# Patient Record
Sex: Male | Born: 2000 | Race: White | Hispanic: No | Marital: Single | Smoking: Never smoker
Health system: Southern US, Community
[De-identification: ages and names within clinical notes are randomized; demographics above are authoritative.]

---
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
## 2021-05-20 IMAGING — CR DG CHEST 2V
1 series · 2 of 2 positions shown · non-contrast
Comparison: None.

CLINICAL DATA: Shortness of breath, [id number] positive, febrile

EXAM:
CHEST - 2 VIEW

[Series 1: dg chest 2 view · 0.14mm/px · 2 of 2 slices shown]
[im 1/2]
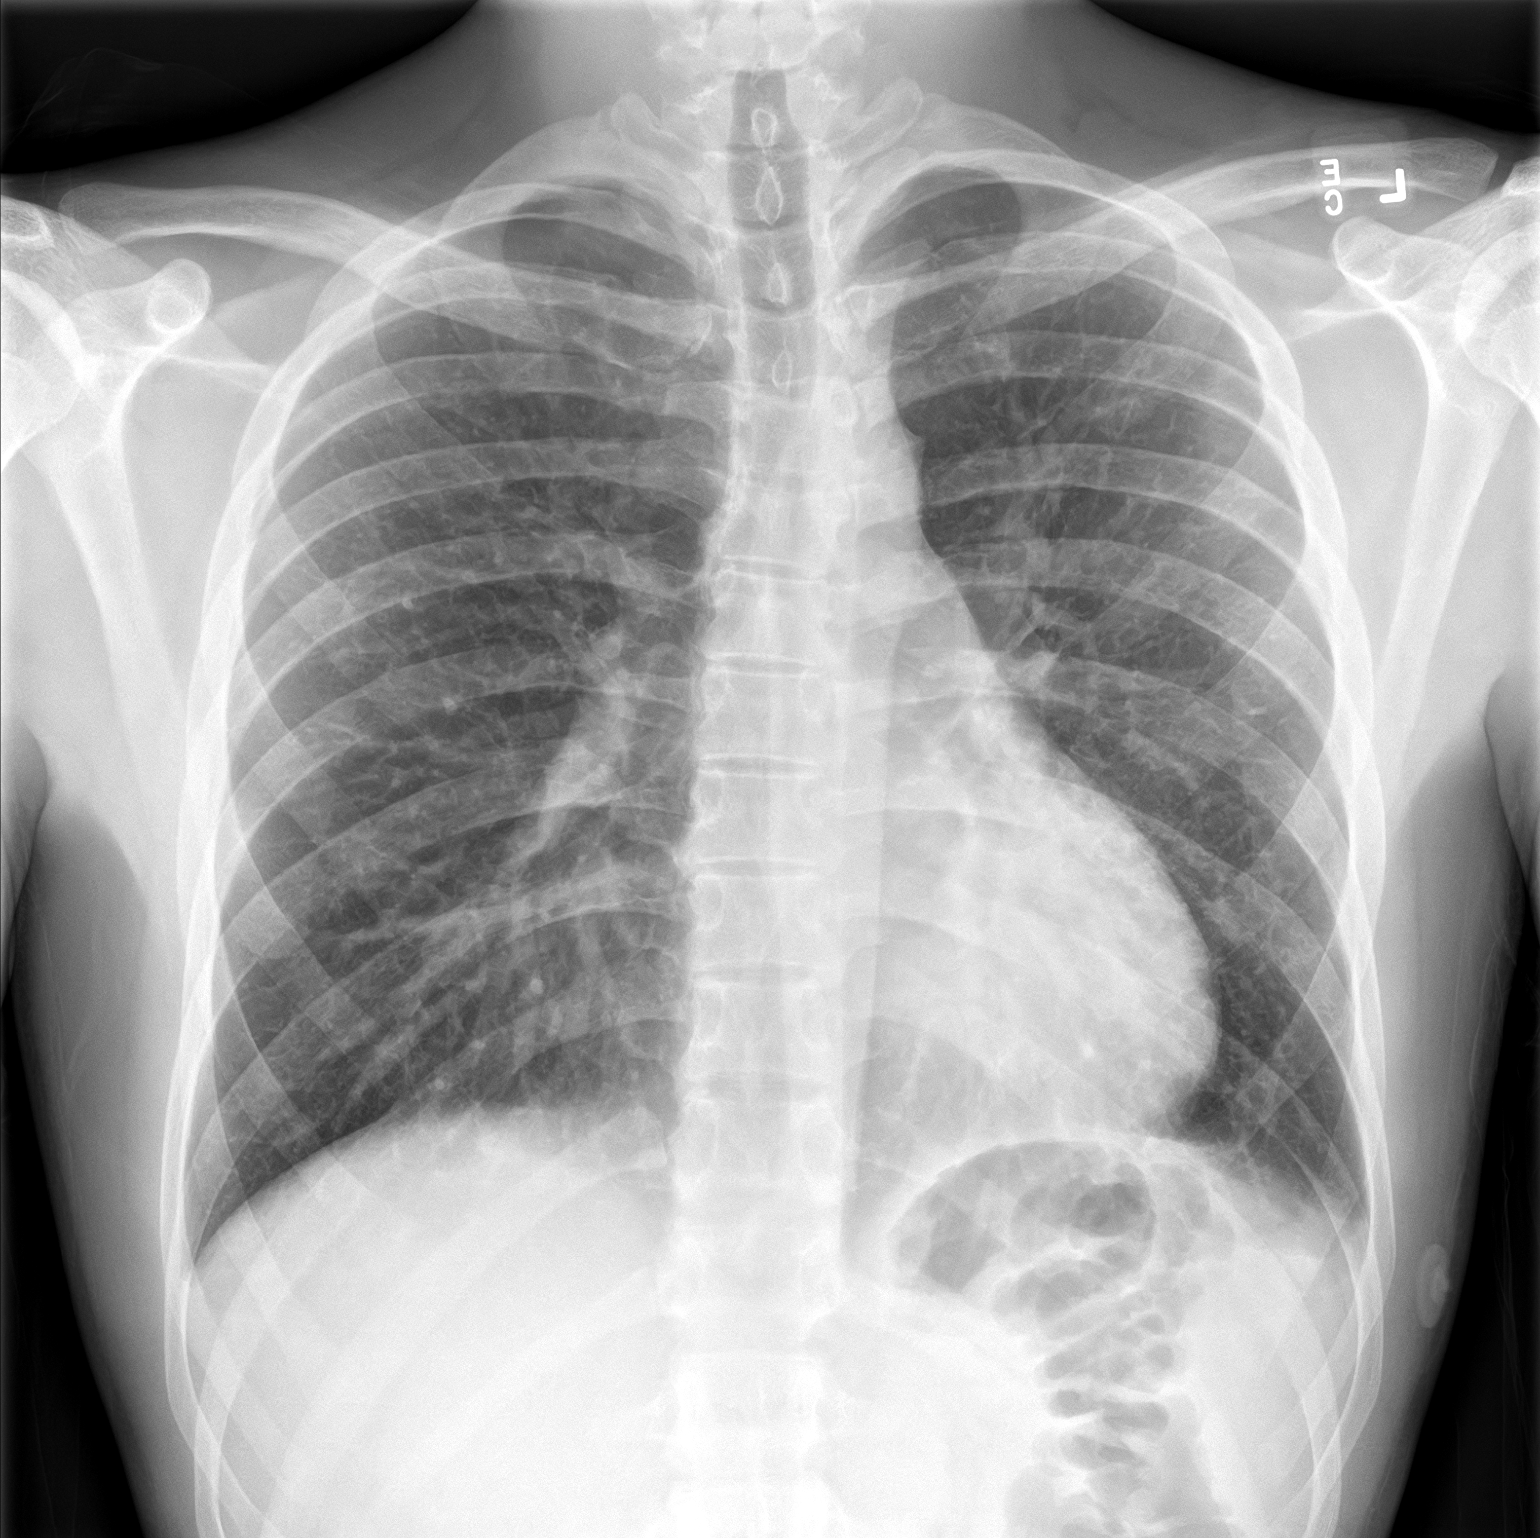
[im 2/2]
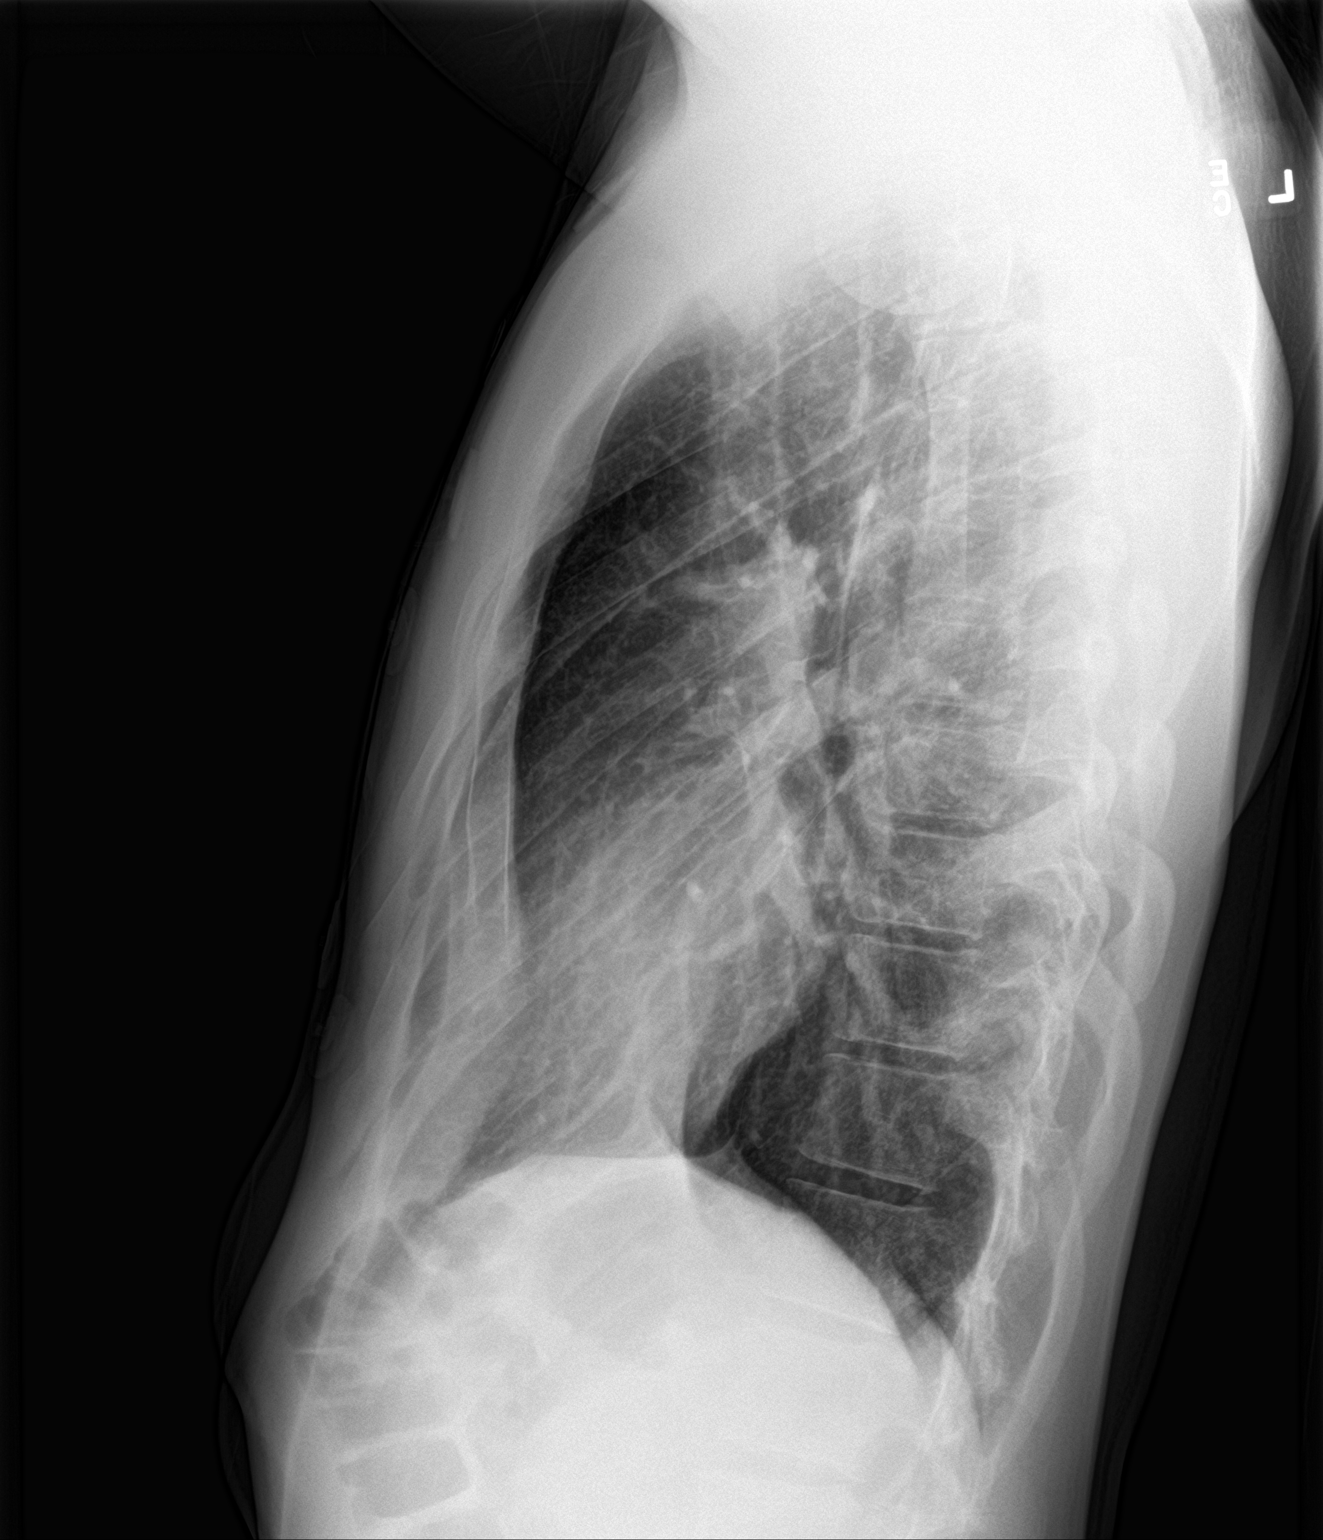

[2 of 2 positions shown; findings below may reference images not displayed]

FINDINGS: Pectus deformity of the chest likely resulting in some increased
attenuation along the lower mediastinum. Accounting for body
habitus, the lungs are clear. No consolidation, features of edema,
pneumothorax, or effusion. Pulmonary vascularity is normally
distributed. The cardiomediastinal contours are unremarkable. No
acute osseous or soft tissue abnormality.
IMPRESSION: No acute cardiopulmonary abnormality.

Pectus deformity of the chest.

## 2021-11-12 ENCOUNTER — Encounter: Payer: Self-pay | Admitting: Emergency Medicine

## 2021-11-12 ENCOUNTER — Ambulatory Visit
Admission: EM | Admit: 2021-11-12 | Discharge: 2021-11-12 | Disposition: A | Discharge status: Home / Self Care | Payer: 59 | Attending: Emergency Medicine | Admitting: Emergency Medicine

## 2021-11-12 DIAGNOSIS — J069 Acute upper respiratory infection, unspecified: Secondary | ICD-10-CM

## 2021-11-12 DIAGNOSIS — H9203 Otalgia, bilateral: Secondary | ICD-10-CM | POA: Diagnosis not present

## 2021-11-12 MED ORDER — IPRATROPIUM BROMIDE 0.06 % NA SOLN: 2.0000 | Freq: Four times a day (QID) | NASAL | 12 refills | Status: DC

## 2021-11-12 MED ORDER — CEFDINIR 300 MG PO CAPS: 300.0000 mg | ORAL_CAPSULE | Freq: Two times a day (BID) | ORAL | 0 refills | Status: AC

## 2021-11-12 NOTE — ED Provider Notes (Signed)
[facility]    CSN: [id number] Arrival date & time: 11/12/21  0806      History   Chief Complaint Chief Complaint  Patient presents with   Otalgia    HPI Riaz Onorato is a 21 y.o. male.   HPI  22 year old male here for evaluation of bilateral elbow pain and chills.  The patient reports that he was seen at [facility] 10 days ago and was treated for an ear infection with a 7-day course of Augmentin.  He states he felt fine while he was on the antibiotic but once he went off he feels like his symptoms returned.  He is also been experiencing nasal congestion, body aches, and a sore throat.  He denies any fever, ringing in his ears, drainage from his ears, changes in hearing, or cough.  History reviewed. No pertinent past medical history.  Patient Active Problem List   Diagnosis Date Noted   Acute hypoxemic respiratory failure due to COVID-19 [facility]) 10/20/2019    History reviewed. No pertinent surgical history.     Home Medications    Prior to Admission medications   Medication Sig Start Date End Date Taking? Authorizing Provider  cefdinir (OMNICEF) 300 MG capsule Take 1 capsule (300 mg total) by mouth 2 (two) times daily for 10 days. 11/12/21 11/22/21 Yes Margarette Canada, NP  ipratropium (ATROVENT) 0.06 % nasal spray Place 2 sprays into both nostrils 4 (four) times daily. 11/12/21  Yes Margarette Canada, NP  albuterol (VENTOLIN HFA) 108 (90 Base) MCG/ACT inhaler Inhale 2 puffs into the lungs every 6 (six) hours as needed for wheezing or shortness of breath. 10/21/19   Sharen Hones, MD    Family History History reviewed. No pertinent family history.  Social History Social History   Tobacco Use   Smoking status: Never   Smokeless tobacco: Never  Vaping Use   Vaping Use: Never used  Substance Use Topics   Alcohol use: Yes     Allergies   Patient has no known allergies.   Review of Systems Review of Systems  Constitutional:  Positive for  chills. Negative for fever.  HENT:  Positive for congestion, ear pain, rhinorrhea and sore throat. Negative for ear discharge, hearing loss and tinnitus.   Respiratory:  Negative for cough.      Physical Exam Triage Vital Signs ED Triage Vitals  Enc Vitals Group     BP 11/12/21 0819 114/66     Pulse Rate 11/12/21 0819 90     Resp 11/12/21 0819 15     Temp 11/12/21 0819 99.1 F (37.3 C)     Temp Source 11/12/21 0819 Oral     SpO2 11/12/21 0819 98 %     Weight 11/12/21 0816 165 lb (74.8 kg)     Height 11/12/21 0816 _0  (1.905 m)     Head Circumference --      Peak Flow --      Pain Score 11/12/21 0816 7     Pain Loc --      Pain Edu? --      Excl. in [place]? --    No data found.  Updated Vital Signs BP 114/66 (BP Location: Right Arm)   Pulse 90   Temp 99.1 F (37.3 C) (Oral)   Resp 15   Ht _1  (1.905 m)   Wt 165 lb (74.8 kg)   SpO2 98%   BMI 20.62 kg/m   Visual Acuity Right Eye Distance:   Left Eye  Distance:   Bilateral Distance:    Right Eye Near:   Left Eye Near:    Bilateral Near:     Physical Exam Vitals and nursing note reviewed.  Constitutional:      Appearance: Normal appearance. He is not ill-appearing.  HENT:     Head: Normocephalic and atraumatic.     Right Ear: Tympanic membrane, ear canal and external ear normal. There is no impacted cerumen.     Left Ear: Tympanic membrane, ear canal and external ear normal. There is no impacted cerumen.     Nose: Congestion and rhinorrhea present.     Mouth/Throat:     Mouth: Mucous membranes are moist.     Pharynx: Oropharynx is clear. Posterior oropharyngeal erythema present. No oropharyngeal exudate.  Cardiovascular:     Rate and Rhythm: Normal rate and regular rhythm.     Pulses: Normal pulses.     Heart sounds: Normal heart sounds. No murmur heard.    No friction rub. No gallop.  Pulmonary:     Effort: Pulmonary effort is normal.     Breath sounds: Normal breath sounds. No wheezing, rhonchi or  rales.  Musculoskeletal:     Cervical back: Normal range of motion and neck supple.  Lymphadenopathy:     Cervical: Cervical adenopathy present.  Skin:    General: Skin is warm and dry.     Capillary Refill: Capillary refill takes less than 2 seconds.     Findings: No erythema or rash.  Neurological:     General: No focal deficit present.     Mental Status: He is alert and oriented to person, place, and time.  Psychiatric:        Mood and Affect: Mood normal.        Behavior: Behavior normal.        Thought Content: Thought content normal.        Judgment: Judgment normal.      UC Treatments / Results  Labs (all labs ordered are listed, but only abnormal results are displayed) Labs Reviewed - No data to display  EKG   Radiology No results found.  Procedures Procedures (including critical care time)  Medications Ordered in UC Medications - No data to display  Initial Impression / Assessment and Plan / UC Course  I have reviewed the triage vital signs and the nursing notes.  Pertinent labs & imaging results that were available during my care of the patient were reviewed by me and considered in my medical decision making (see chart for details).   Patient is a pleasant, nontoxic-appearing 21 year old male here for evaluation of bilateral ear pain chills as outlined HPI above.  He was treated 10 days ago with a 7-day course of Augmentin for left otitis media.  He also had bilateral impacted cerumen, sore throat, and congestion.  He had COVID, flu, RSV, and strep testing that were all negative.  He reports that while he was on the Augmentin he felt fine but once the Augmentin was finished his symptoms returned.  His physical exam reveals pearly-gray tympanic membranes bilaterally with no evidence of effusion and no erythema.  Both external auditory canals are clear.  There is a mild amount of cerumen in the left EAC but there is no impaction that I can clearly visualize the  tympanic membrane.  His nasal mucosa is erythematous and edematous and there is clear rhinorrhea in both nares.  Oropharyngeal exam reveals 1+ edema and erythema to bilateral tonsillar pillars, soft  palate, and posterior oropharynx.  There is also injection of the posterior oropharynx.  No exudate appreciated.  There is bilateral anterior cervical lymphadenopathy on exam.  His cardiopulmonary exam is benign.  I am suspicious that patient has a bacterial process that was not fully resolved by the Augmentin.  I will discharge him home on a 10-day course of cefdinir twice daily.  I am also can prescribe him Atrovent nasal spray to help with the rhinorrhea and postnasal drip which may be contributing to the erythema and irritation in the back of his throat.  He can continue salt water gargles, Tylenol and ibuprofen as needed for pain, and have instructed him to follow-up with his PCP if his symptoms return following antibiotics, or if they worsen was on antibiotics, as he may need further testing and/or referral to ENT.   Final Clinical Impressions(s) / UC Diagnoses   Final diagnoses:  Upper respiratory tract infection, unspecified type  Otalgia of both ears     Discharge Instructions      The Cefdinir twice daily with food for 10 days for treatment of your URI.  Perform sinus irrigation 2-3 times a day with a NeilMed sinus rinse kit and distilled water.  Do not use tap water.  You can use plain over-the-counter Mucinex every 6 hours to break up the stickiness of the mucus so your body can clear it.  Increase your oral fluid intake to thin out your mucus so that is also able for your body to clear more easily.  Take an over-the-counter probiotic, such as Culturelle-align-activia, 1 hour after each dose of antibiotic to prevent diarrhea.  Use the Atrovent nasal spray, 2 squirts in each nostril every 6 hours as needed for congestion and postnasal drip.  If you develop any new or worsening  symptoms return for reevaluation or see your primary care provider.      ED Prescriptions     Medication Sig Dispense Auth. Provider   cefdinir (OMNICEF) 300 MG capsule Take 1 capsule (300 mg total) by mouth 2 (two) times daily for 10 days. 20 capsule Margarette Canada, NP   ipratropium (ATROVENT) 0.06 % nasal spray Place 2 sprays into both nostrils 4 (four) times daily. 15 mL Margarette Canada, NP      PDMP not reviewed this encounter.   Margarette Canada, NP 11/12/21 [phone]

## 2021-11-12 NOTE — Discharge Instructions (Signed)
The Cefdinir twice daily with food for 10 days for treatment of your URI.  Perform sinus irrigation 2-3 times a day with a NeilMed sinus rinse kit and distilled water.  Do not use tap water.  You can use plain over-the-counter Mucinex every 6 hours to break up the stickiness of the mucus so your body can clear it.  Increase your oral fluid intake to thin out your mucus so that is also able for your body to clear more easily.  Take an over-the-counter probiotic, such as Culturelle-align-activia, 1 hour after each dose of antibiotic to prevent diarrhea.  Use the Atrovent nasal spray, 2 squirts in each nostril every 6 hours as needed for congestion and postnasal drip.  If you develop any new or worsening symptoms return for reevaluation or see your primary care provider.

## 2021-11-12 NOTE — ED Triage Notes (Signed)
Patient was treated for left ear infection over a week ago.   Patient reports pain in both ears that started yesterday.  Patient reports chills.

## 2021-12-16 ENCOUNTER — Encounter: Payer: Self-pay | Admitting: Emergency Medicine

## 2021-12-16 ENCOUNTER — Ambulatory Visit
Admission: EM | Admit: 2021-12-16 | Discharge: 2021-12-16 | Disposition: A | Discharge status: Home / Self Care | Payer: 59 | Attending: Physician Assistant | Admitting: Physician Assistant

## 2021-12-16 DIAGNOSIS — Z1152 Encounter for screening for COVID-19: Secondary | ICD-10-CM | POA: Insufficient documentation

## 2021-12-16 DIAGNOSIS — J029 Acute pharyngitis, unspecified: Secondary | ICD-10-CM | POA: Insufficient documentation

## 2021-12-16 LAB — RESP PANEL BY RT-PCR (FLU A&B, COVID) ARPGX2
Influenza A by PCR: NEGATIVE
Influenza B by PCR: NEGATIVE
SARS Coronavirus 2 by RT PCR: NEGATIVE

## 2021-12-16 LAB — GROUP A STREP BY PCR: Group A Strep by PCR: NOT DETECTED

## 2021-12-16 MED ORDER — DEXAMETHASONE SODIUM PHOSPHATE 10 MG/ML IJ SOLN
10.0000 mg | Freq: Once | INTRAMUSCULAR | Status: AC
  Administered 2021-12-16: 10 mg via INTRAMUSCULAR

## 2021-12-16 NOTE — ED Triage Notes (Signed)
Patient c/o sore throat, congestion and slight cough that started yesterday.  Patient denies fevers.

## 2021-12-16 NOTE — ED Provider Notes (Signed)
[facility]    CSN: [id number] Arrival date & time: 12/16/21  1114      History   Chief Complaint Chief Complaint  Patient presents with   Sore Throat    HPI Edward Aguilar is a 21 y.o. male presents to UC with complaint of nasal congestion and sore throat.  This started yesterday.  He did have some difficulty swallowing yesterday but feels improved today.  He denies headache, runny nose, ear pain, cough, shortness of breath, nausea, vomiting or diarrhea.  He denies fever, chills or body aches.  He has tried cough drops OTC with minimal relief of symptoms.  He has not had sick contacts that he is aware of.  He has not taken a home COVID test.  HPI  History reviewed. No pertinent past medical history.  Patient Active Problem List   Diagnosis Date Noted   Acute hypoxemic respiratory failure due to COVID-19 [facility]) 10/20/2019    History reviewed. No pertinent surgical history.     Home Medications    Prior to Admission medications   Medication Sig Start Date End Date Taking? Authorizing Provider  albuterol (VENTOLIN HFA) 108 (90 Base) MCG/ACT inhaler Inhale 2 puffs into the lungs every 6 (six) hours as needed for wheezing or shortness of breath. 10/21/19   Marrion Coy, MD  ipratropium (ATROVENT) 0.06 % nasal spray Place 2 sprays into both nostrils 4 (four) times daily. 11/12/21   Becky Augusta, NP    Family History History reviewed. No pertinent family history.  Social History Social History   Tobacco Use   Smoking status: Never   Smokeless tobacco: Never  Vaping Use   Vaping Use: Never used  Substance Use Topics   Alcohol use: Yes   Drug use: Never     Allergies   Patient has no known allergies.   Review of Systems Review of Systems   Constitutional: Denies fever, malaise, fatigue, headache or abrupt weight changes.  HEENT: Patient reports nasal congestion and sore throat.  Denies eye pain, eye redness, ear pain, ringing in the ears, wax  buildup, runny nose, bloody nose. Respiratory: Denies difficulty breathing, shortness of breath, cough or sputum production.   Cardiovascular: Denies chest pain, chest tightness, palpitations or swelling in the hands or feet.  Gastrointestinal: Denies abdominal pain, bloating, constipation, diarrhea or blood in the stool.   No other specific complaints in a complete review of systems (except as listed in HPI above).   Physical Exam Triage Vital Signs ED Triage Vitals  Enc Vitals Group     BP 12/16/21 1138 113/64     Pulse Rate 12/16/21 1138 63     Resp 12/16/21 1138 14     Temp 12/16/21 1138 97.9 F (36.6 C)     Temp Source 12/16/21 1138 Oral     SpO2 12/16/21 1138 100 %     Weight 12/16/21 1136 165 lb (74.8 kg)     Height 12/16/21 1136 6\' 3"  (1.905 m)     Head Circumference --      Peak Flow --      Pain Score 12/16/21 1136 4     Pain Loc --      Pain Edu? --      Excl. in [place]? --    No data found.  Updated Vital Signs BP 113/64 (BP Location: Left Arm)   Pulse 63   Temp 97.9 F (36.6 C) (Oral)   Resp 14   Ht 6\' 3"  (1.905 m)  Wt 165 lb (74.8 kg)   SpO2 100%   BMI 20.62 kg/m      Physical Exam  BP 113/64 (BP Location: Left Arm)   Pulse 63   Temp 97.9 F (36.6 C) (Oral)   Resp 14   Ht 6\' 3"  (1.905 m)   Wt 165 lb (74.8 kg)   SpO2 100%   BMI 20.62 kg/m  Wt Readings from Last 3 Encounters:  12/16/21 165 lb (74.8 kg)  11/12/21 165 lb (74.8 kg)  10/20/19 154 lb 12.2 oz (70.2 kg) (54 %, Z= 0.10)*   * Growth percentiles are based on CDC (Boys, 2-20 Years) data.    General: Appears his stated age, well developed, well nourished in NAD. Skin: Warm, dry and intact. No rashes noted. HEENT: Head: normal shape and size, no sinus tenderness noted; Eyes: sclera white, no icterus, conjunctiva pink, PERRLA and EOMs intact; Throat/Mouth: Teeth present, mucosa erythematous and moist, no exudate, lesions or ulcerations noted.  Neck: Bilateral cervical adenopathy, R  >L. Cardiovascular: Normal rate and rhythm. S1,S2 noted.  No murmur, rubs or gallops noted.  Pulmonary/Chest: Normal effort and positive vesicular breath sounds. No respiratory distress. No wheezes, rales or ronchi noted.  Neurological: Alert and oriented.    UC Treatments / Results  Labs  Labs Reviewed  GROUP A STREP BY PCR  RESP PANEL BY RT-PCR (FLU A&B, COVID) [id number]    Medications Ordered in UC Meds ordered this encounter  Medications   dexamethasone (DECADRON) injection 10 mg    Initial Impression / Assessment and Plan / UC Course  I have reviewed the triage vital signs and the nursing notes.  Pertinent labs & imaging results that were available during my care of the patient were reviewed by me and considered in my medical decision making (see chart for details).     21 year old male with complaint of nasal congestion and sore throat x1 day.  Was treated for URI 1 month ago.  Rapid strep negative.  COVID/flu swab negative.  Advised him that his symptoms are likely viral.  Given that he is feeling better, we will treat with Decadron 10 mg IM for the swelling of the throat and lymph nodes.  No indication for antibiotics at this time.  Advised him that he can take ibuprofen 600 mg every 12 hours as needed-consume with food for persistent inflammation.  He may want to consider seeing ENT in follow-up if his symptoms persist, worsen or recur as this is the third time he has been seen in 8 weeks for a similar issue. Final Clinical Impressions(s) / UC Diagnoses   Final diagnoses:  Viral pharyngitis     Discharge Instructions      You were seen today for sore throat and swollen lymph nodes.  Your COVID, flu and strep test were all negative.  This is likely viral.  I gave you a steroid shot today to help with the swelling in the throat and lymph nodes.  You may take ibuprofen 600 mg every 12 hours if the throat pain persist.  Please take this with food.  Salt water gargles may also  be helpful.  You may want to consider seeing a ear nose and throat specialist if your symptoms recur.     ED Prescriptions   None    PDMP not reviewed this encounter.   Jearld Fenton, NP 12/16/21 1231

## 2021-12-16 NOTE — Discharge Instructions (Signed)
You were seen today for sore throat and swollen lymph nodes.  Your COVID, flu and strep test were all negative.  This is likely viral.  I gave you a steroid shot today to help with the swelling in the throat and lymph nodes.  You may take ibuprofen 600 mg every 12 hours if the throat pain persist.  Please take this with food.  Salt water gargles may also be helpful.  You may want to consider seeing a ear nose and throat specialist if your symptoms recur.

## 2022-10-08 ENCOUNTER — Ambulatory Visit
Admission: EM | Admit: 2022-10-08 | Discharge: 2022-10-08 | Disposition: A | Discharge status: Home / Self Care | Payer: 59 | Attending: Physician Assistant | Admitting: Physician Assistant

## 2022-10-08 DIAGNOSIS — J180 Bronchopneumonia, unspecified organism: Secondary | ICD-10-CM

## 2022-10-08 DIAGNOSIS — R0981 Nasal congestion: Secondary | ICD-10-CM | POA: Diagnosis not present

## 2022-10-08 DIAGNOSIS — R051 Acute cough: Secondary | ICD-10-CM

## 2022-10-08 MED ORDER — PREDNISONE 20 MG PO TABS: 40.0000 mg | ORAL_TABLET | Freq: Every day | ORAL | 0 refills | Status: AC

## 2022-10-08 MED ORDER — PROMETHAZINE-DM 6.25-15 MG/5ML PO SYRP: 5.0000 mL | ORAL_SOLUTION | Freq: Four times a day (QID) | ORAL | 0 refills | Status: AC | PRN

## 2022-10-08 MED ORDER — AZITHROMYCIN 250 MG PO TABS: 250.0000 mg | ORAL_TABLET | Freq: Every day | ORAL | 0 refills | Status: AC

## 2022-10-08 NOTE — ED Triage Notes (Signed)
Pt c/o SOB, cough x1-2weeks  Pt states that when he blows his knows, he feels water in his ear and when he takes a deep breath he feels bubbling in his chest.

## 2022-10-08 NOTE — Discharge Instructions (Addendum)
-  Symptoms consistent bronchitis bordering on pneumonia.  I sent antibiotics to pharmacy as well as corticosteroids in combination.  Bronchitis can last for 4 to 6 weeks, sometimes longer.  You should return if you have fever, worsening cough, increased shortness of breath or weakness.

## 2022-10-08 NOTE — ED Provider Notes (Signed)
[facility]    CSN: [id number] Arrival date & time: 10/08/22  1450      History   Chief Complaint Chief Complaint  Patient presents with   Nasal Congestion    HPI Edward Aguilar is a 22 y.o. male presenting for 1.5 to 2-week history of cough, congestion, sinus pressure and shortness of breath.  Reports feeling like his ears are full of fluid.  He also reports and feeling like his chest is phlegm mucus.  Denies fever, sore throat, abdominal pain, vomiting or diarrhea.  Has been taking OTC meds without relief.  He has no history of asthma and is a non smoker.  He does have a history of respiratory failure due to COVID-19 in 2021.  He says ever since he had COVID and COVID-pneumonia he has had bronchitis yearly.  HPI  History reviewed. No pertinent past medical history.  Patient Active Problem List   Diagnosis Date Noted   Acute hypoxemic respiratory failure due to COVID-19 [facility]) 10/20/2019    History reviewed. No pertinent surgical history.     Home Medications    Prior to Admission medications   Medication Sig Start Date End Date Taking? Authorizing Provider  azithromycin (ZITHROMAX) 250 MG tablet Take 1 tablet (250 mg total) by mouth daily. Take first 2 tablets together, then 1 every day until finished. 10/08/22  Yes Eusebio Friendly B, PA-C  ipratropium (ATROVENT) 0.06 % nasal spray Place 2 sprays into both nostrils 4 (four) times daily. 11/12/21  Yes Becky Augusta, NP  predniSONE (DELTASONE) 20 MG tablet Take 2 tablets (40 mg total) by mouth daily for 5 days. 10/08/22 10/13/22 Yes Shirlee Latch, PA-C  promethazine-dextromethorphan (PROMETHAZINE-DM) 6.25-15 MG/5ML syrup Take 5 mLs by mouth 4 (four) times daily as needed. 10/08/22  Yes Shirlee Latch, PA-C  albuterol (VENTOLIN HFA) 108 (90 Base) MCG/ACT inhaler Inhale 2 puffs into the lungs every 6 (six) hours as needed for wheezing or shortness of breath. 10/21/19   Marrion Coy, MD    Family History History  reviewed. No pertinent family history.  Social History Social History   Tobacco Use   Smoking status: Never   Smokeless tobacco: Never  Vaping Use   Vaping status: Never Used  Substance Use Topics   Alcohol use: Yes   Drug use: Never     Allergies   Patient has no known allergies.   Review of Systems Review of Systems  Constitutional:  Positive for fatigue. Negative for fever.  HENT:  Positive for congestion, rhinorrhea and sinus pressure. Negative for sinus pain and sore throat.   Respiratory:  Positive for cough, shortness of breath and wheezing.   Cardiovascular:  Negative for chest pain.  Gastrointestinal:  Negative for abdominal pain, diarrhea, nausea and vomiting.  Musculoskeletal:  Negative for myalgias.  Neurological:  Negative for weakness, light-headedness and headaches.  Hematological:  Negative for adenopathy.     Physical Exam Triage Vital Signs ED Triage Vitals  Encounter Vitals Group     BP      Systolic BP Percentile      Diastolic BP Percentile      Pulse      Resp      Temp      Temp src      SpO2      Weight      Height      Head Circumference      Peak Flow      Pain Score  Pain Loc      Pain Education      Exclude from Growth Chart    No data found.  Updated Vital Signs BP 119/71 (BP Location: Left Arm)   Pulse 76   Temp 97.7 F (36.5 C) (Oral)   Ht 6\' 3"  (1.905 m)   Wt 165 lb (74.8 kg)   SpO2 100%   BMI 20.62 kg/m    Physical Exam Vitals and nursing note reviewed.  Constitutional:      General: He is not in acute distress.    Appearance: Normal appearance. He is well-developed. He is not ill-appearing.  HENT:     Head: Normocephalic and atraumatic.     Right Ear: Ear canal and external ear normal. A middle ear effusion is present.     Left Ear: Ear canal and external ear normal. A middle ear effusion is present.     Nose: Congestion present.     Mouth/Throat:     Mouth: Mucous membranes are moist.     Pharynx:  Oropharynx is clear.  Eyes:     General: No scleral icterus.    Conjunctiva/sclera: Conjunctivae normal.  Cardiovascular:     Rate and Rhythm: Normal rate and regular rhythm.     Heart sounds: Normal heart sounds.  Pulmonary:     Effort: Pulmonary effort is normal. No respiratory distress.     Breath sounds: Wheezing and rhonchi (scattered rhonchi and wheezing throughout left lung fields) present. No rales.  Musculoskeletal:     Cervical back: Neck supple.  Skin:    General: Skin is warm and dry.     Capillary Refill: Capillary refill takes less than 2 seconds.  Neurological:     General: No focal deficit present.     Mental Status: He is alert. Mental status is at baseline.     Motor: No weakness.     Gait: Gait normal.  Psychiatric:        Mood and Affect: Mood normal.        Behavior: Behavior normal.      UC Treatments / Results  Labs (all labs ordered are listed, but only abnormal results are displayed) Labs Reviewed - No data to display  EKG   Radiology No results found.  Procedures Procedures (including critical care time)  Medications Ordered in UC Medications - No data to display  Initial Impression / Assessment and Plan / UC Course  I have reviewed the triage vital signs and the nursing notes.  Pertinent labs & imaging results that were available during my care of the patient were reviewed by me and considered in my medical decision making (see chart for details).   22 year old male presents for nasal congestion, sinus pressure, cough, chest congestion x 1 to 2 weeks.  Denies fever.  Has had some shortness of breath.  Vitals are all normal and stable and he is overall well-appearing.  No acute distress.  On exam he has nasal congestion, clear effusion of bilateral TMs.  Throat is clear.  Scattered rhonchi and wheezing throughout left lung fields.  I bronchitis, bronchopneumonia.  Advised supportive care with increasing rest and fluids.  Sent Promethazine  DM to pharmacy.  Also sent azithromycin and prednisone.  Reviewed that he may be ill for another week or 2 but should be feeling better over the next week.  Explain that he should return if fever or any worsening symptoms.  Final Clinical Impressions(s) / UC Diagnoses   Final diagnoses:  Bronchopneumonia  Nasal congestion  Acute cough     Discharge Instructions      -Symptoms consistent bronchitis bordering on pneumonia.  I sent antibiotics to pharmacy as well as corticosteroids in combination.  Bronchitis can last for 4 to 6 weeks, sometimes longer.  You should return if you have fever, worsening cough, increased shortness of breath or weakness.     ED Prescriptions     Medication Sig Dispense Auth. Provider   azithromycin (ZITHROMAX) 250 MG tablet Take 1 tablet (250 mg total) by mouth daily. Take first 2 tablets together, then 1 every day until finished. 6 tablet Shirlee Latch, PA-C   promethazine-dextromethorphan (PROMETHAZINE-DM) 6.25-15 MG/5ML syrup Take 5 mLs by mouth 4 (four) times daily as needed. 118 mL Eusebio Friendly B, PA-C   predniSONE (DELTASONE) 20 MG tablet Take 2 tablets (40 mg total) by mouth daily for 5 days. 10 tablet Gareth Morgan      PDMP not reviewed this encounter.   Shirlee Latch, PA-C 10/08/22 [phone]

## 2023-01-09 ENCOUNTER — Ambulatory Visit
Admission: RE | Admit: 2023-01-09 | Discharge: 2023-01-09 | Disposition: A | Discharge status: Home / Self Care | Payer: Self-pay | Source: Ambulatory Visit | Attending: Physician Assistant | Admitting: *Deleted

## 2023-01-09 ENCOUNTER — Ambulatory Visit
Admission: RE | Admit: 2023-01-09 | Discharge: 2023-01-09 | Disposition: A | Discharge status: Home / Self Care | Payer: Self-pay | Source: Ambulatory Visit | Attending: Physician Assistant | Admitting: Physician Assistant

## 2023-01-09 ENCOUNTER — Other Ambulatory Visit: Payer: Self-pay | Admitting: Physician Assistant

## 2023-01-09 DIAGNOSIS — Z0000 Encounter for general adult medical examination without abnormal findings: Secondary | ICD-10-CM

## 2023-12-26 ENCOUNTER — Encounter: Payer: Self-pay | Admitting: Emergency Medicine

## 2023-12-26 ENCOUNTER — Ambulatory Visit: Admission: EM | Admit: 2023-12-26 | Discharge: 2023-12-26 | Disposition: A | Discharge status: Home / Self Care

## 2023-12-26 DIAGNOSIS — J069 Acute upper respiratory infection, unspecified: Secondary | ICD-10-CM | POA: Diagnosis not present

## 2023-12-26 DIAGNOSIS — H9203 Otalgia, bilateral: Secondary | ICD-10-CM | POA: Diagnosis not present

## 2023-12-26 DIAGNOSIS — J0101 Acute recurrent maxillary sinusitis: Secondary | ICD-10-CM

## 2023-12-26 MED ORDER — AMOXICILLIN-POT CLAVULANATE 875-125 MG PO TABS: 1.0000 | ORAL_TABLET | Freq: Two times a day (BID) | ORAL | 0 refills | Status: AC

## 2023-12-26 MED ORDER — IPRATROPIUM BROMIDE 0.06 % NA SOLN: 2.0000 | Freq: Four times a day (QID) | NASAL | 0 refills | Status: AC

## 2023-12-26 MED ORDER — ALBUTEROL SULFATE HFA 108 (90 BASE) MCG/ACT IN AERS: 2.0000 | INHALATION_SPRAY | Freq: Four times a day (QID) | RESPIRATORY_TRACT | 0 refills | Status: AC | PRN

## 2023-12-26 NOTE — ED Provider Notes (Signed)
 [facility]    CSN: [id number] Arrival date & time: 12/26/23  9187      History   Chief Complaint Chief Complaint  Patient presents with   Otalgia   sinus pressure    Headache   Cough   Nasal Congestion    HPI Edward Aguilar is a 23 y.o. male.   23 year old male pt, Edward Aguilar, presents to urgent care for bilateral earache x 2 days L>R. 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 Pt has been taking OTC meds without relief. Needs refill on albuterol  inhaler and atrovent  nasal spray  The history is provided by the patient. No language interpreter was used.    History reviewed. No pertinent past medical history.  Patient Active Problem List   Diagnosis Date Noted   Acute upper respiratory infection 12/26/2023   Acute recurrent maxillary sinusitis 12/26/2023   Acute ear pain, bilateral 12/26/2023   Acute hypoxemic respiratory failure due to COVID-19 [facility]) 10/20/2019    History reviewed. No pertinent surgical history.     Home Medications    Prior to Admission medications   Medication Sig Start Date End Date Taking? Authorizing Provider  amoxicillin-clavulanate (AUGMENTIN) 875-125 MG tablet Take 1 tablet by mouth every 12 (twelve) hours. 12/26/23  Yes Aleicia Kenagy, NP  montelukast (SINGULAIR) 10 MG tablet Take 10 mg by mouth at bedtime. 07/31/22  Yes [provider]  albuterol  (VENTOLIN  HFA) 108 (90 Base) MCG/ACT inhaler Inhale 2 puffs into the lungs every 6 (six) hours as needed for wheezing or shortness of breath. 12/26/23   Alyssa Mancera, NP  azithromycin  (ZITHROMAX ) 250 MG tablet Take 1 tablet (250 mg total) by mouth daily. Take first 2 tablets together, then 1 every day until finished. 10/08/22   Arvis Huxley B, PA-C  ipratropium (ATROVENT ) 0.06 % nasal spray Place 2 sprays into both nostrils 4 (four) times daily. 12/26/23   Jadie Allington, NP  promethazine -dextromethorphan   (PROMETHAZINE -DM) 6.25-15 MG/5ML syrup Take 5 mLs by mouth 4 (four) times daily as needed. 10/08/22   Arvis Huxley NOVAK, PA-C    Family History History reviewed. No pertinent family history.  Social History Social History   Tobacco Use   Smoking status: Never   Smokeless tobacco: Never  Vaping Use   Vaping status: Never Used  Substance Use Topics   Alcohol use: Yes   Drug use: Never     Allergies   Patient has no known allergies.   Review of Systems Review of Systems  Constitutional:  Negative for fever.  HENT:  Positive for ear pain, sinus pressure and sinus pain.   Respiratory:  Positive for cough.   All other systems reviewed and are negative.    Physical Exam Triage Vital Signs ED Triage Vitals  Encounter Vitals Group     BP      Girls Systolic BP Percentile      Girls Diastolic BP Percentile      Boys Systolic BP Percentile      Boys Diastolic BP Percentile      Pulse      Resp      Temp      Temp src      SpO2      Weight      Height      Head Circumference      Peak Flow      Pain Score      Pain Loc  Pain Education      Exclude from Growth Chart    No data found.  Updated Vital Signs BP 125/70 (BP Location: Left Arm)   Pulse 85   Temp 98.8 F (37.1 C) (Oral)   Resp 17   Wt 177 lb (80.3 kg)   SpO2 100%   BMI 22.12 kg/m   Visual Acuity Right Eye Distance:   Left Eye Distance:   Bilateral Distance:    Right Eye Near:   Left Eye Near:    Bilateral Near:     Physical Exam Vitals and nursing note reviewed.  Constitutional:      Appearance: He is well-developed and well-groomed.  HENT:     Head: Normocephalic.     Right Ear: Tympanic membrane is retracted.     Left Ear: Tympanic membrane is retracted.     Nose: Mucosal edema and congestion present.     Right Sinus: Maxillary sinus tenderness present.     Left Sinus: Maxillary sinus tenderness present.     Mouth/Throat:     Lips: Pink.     Mouth: Mucous membranes are moist.      Pharynx: Oropharynx is clear. Uvula midline. Postnasal drip present.     Tonsils: No tonsillar exudate.  Cardiovascular:     Rate and Rhythm: Normal rate and regular rhythm.     Heart sounds: Normal heart sounds.  Pulmonary:     Effort: Pulmonary effort is normal.     Breath sounds: Normal breath sounds and air entry.  Neurological:     General: No focal deficit present.     Mental Status: He is alert and oriented to person, place, and time.     GCS: GCS eye subscore is 4. GCS verbal subscore is 5. GCS motor subscore is 6.  Psychiatric:        Attention and Perception: Attention normal.        Mood and Affect: Mood normal.        Speech: Speech normal.        Behavior: Behavior normal. Behavior is cooperative.      UC Treatments / Results  Labs (all labs ordered are listed, but only abnormal results are displayed) Labs Reviewed - No data to display  EKG   Radiology No results found.  Procedures Procedures (including critical care time)  Medications Ordered in UC Medications - No data to display  Initial Impression / Assessment and Plan / UC Course  I have reviewed the triage vital signs and the nursing notes.  Pertinent labs & imaging results that were available during my care of the patient were reviewed by me and considered in my medical decision making (see chart for details).    Discussed exam findings and plan of care with patient, scripted Augmentin, refilled Atrovent  nasal spray and albuterol  inhaler, continue Singulair as well, push fluids,  strict go to ER precautions given.   Patient verbalized understanding to this provider.  Ddx: Acute respiratory infection, sinusitis, ear pain, viral illness, allergies Final Clinical Impressions(s) / UC Diagnoses   Final diagnoses:  Acute upper respiratory infection  Acute recurrent maxillary sinusitis  Acute ear pain, bilateral     Discharge Instructions      Take augmentin as prescribed, refilled  meds(atrovent ,albuterol ). Drink plenty of water. Follow up with PCP. Return as needed.     ED Prescriptions     Medication Sig Dispense Auth. Provider   ipratropium (ATROVENT ) 0.06 % nasal spray Place 2 sprays into both nostrils  4 (four) times daily. 15 mL Daleisa Halperin, NP   albuterol  (VENTOLIN  HFA) 108 (90 Base) MCG/ACT inhaler Inhale 2 puffs into the lungs every 6 (six) hours as needed for wheezing or shortness of breath. 18 g Shylynn Bruning, NP   amoxicillin-clavulanate (AUGMENTIN) 875-125 MG tablet Take 1 tablet by mouth every 12 (twelve) hours. 14 tablet Akaisha Truman, Rilla, NP      PDMP not reviewed this encounter.   Aminta Rilla, NP 12/26/23 [phone]

## 2023-12-26 NOTE — Discharge Instructions (Signed)
 Take augmentin as prescribed, refilled meds(atrovent ,albuterol ). Drink plenty of water. Follow up with PCP. Return as needed.

## 2023-12-26 NOTE — ED Triage Notes (Signed)
 Pt presents with left ear pain, sinus pressure, headache, nasal congestion and cough x 1 week. He has taken OTC cold medication with no relief.
